# Patient Record
Sex: Female | Born: 1955 | Race: White | Hispanic: No | Marital: Single
Health system: Southern US, Community
[De-identification: ages and names within clinical notes are randomized; demographics above are authoritative.]

## PROBLEM LIST (undated history)

## (undated) DIAGNOSIS — C801 Malignant (primary) neoplasm, unspecified: Secondary | ICD-10-CM

## (undated) HISTORY — PX: ABDOMINAL HYSTERECTOMY: SHX81

---

## 2006-09-06 ENCOUNTER — Ambulatory Visit: Payer: Self-pay

## 2007-01-04 ENCOUNTER — Ambulatory Visit: Payer: Self-pay | Admitting: Unknown Physician Specialty

## 2007-05-09 ENCOUNTER — Ambulatory Visit: Payer: Self-pay | Admitting: Obstetrics and Gynecology

## 2007-10-16 ENCOUNTER — Ambulatory Visit: Payer: Self-pay

## 2008-11-05 ENCOUNTER — Ambulatory Visit: Payer: Self-pay | Admitting: Obstetrics and Gynecology

## 2009-02-14 DIAGNOSIS — C801 Malignant (primary) neoplasm, unspecified: Secondary | ICD-10-CM

## 2009-02-14 HISTORY — DX: Malignant (primary) neoplasm, unspecified: C80.1

## 2009-12-02 ENCOUNTER — Ambulatory Visit: Payer: Self-pay | Admitting: Obstetrics and Gynecology

## 2010-01-21 ENCOUNTER — Ambulatory Visit: Payer: Self-pay | Admitting: Internal Medicine

## 2010-07-27 ENCOUNTER — Ambulatory Visit: Payer: Self-pay | Admitting: Obstetrics and Gynecology

## 2011-09-13 ENCOUNTER — Ambulatory Visit: Payer: Self-pay | Admitting: Obstetrics and Gynecology

## 2012-07-31 ENCOUNTER — Ambulatory Visit: Payer: Self-pay | Admitting: Obstetrics and Gynecology

## 2012-10-24 ENCOUNTER — Ambulatory Visit: Payer: Self-pay | Admitting: Obstetrics and Gynecology

## 2013-12-19 ENCOUNTER — Ambulatory Visit: Payer: Self-pay | Admitting: Obstetrics and Gynecology

## 2014-07-09 ENCOUNTER — Other Ambulatory Visit: Payer: Self-pay | Admitting: Obstetrics and Gynecology

## 2014-07-09 DIAGNOSIS — Z1231 Encounter for screening mammogram for malignant neoplasm of breast: Secondary | ICD-10-CM

## 2014-08-25 ENCOUNTER — Ambulatory Visit
Admission: RE | Admit: 2014-08-25 | Discharge: 2014-08-25 | Disposition: A | Discharge status: Home / Self Care | Payer: BLUE CROSS/BLUE SHIELD | Source: Ambulatory Visit | Attending: Physician Assistant | Admitting: Physician Assistant

## 2014-08-25 ENCOUNTER — Ambulatory Visit
Admission: RE | Admit: 2014-08-25 | Discharge: 2014-08-25 | Disposition: A | Discharge status: Home / Self Care | Payer: BLUE CROSS/BLUE SHIELD | Source: Ambulatory Visit | Attending: Internal Medicine | Admitting: Internal Medicine

## 2014-08-25 ENCOUNTER — Other Ambulatory Visit: Payer: Self-pay | Admitting: Physician Assistant

## 2014-08-25 DIAGNOSIS — G8929 Other chronic pain: Secondary | ICD-10-CM

## 2014-08-25 DIAGNOSIS — M79645 Pain in left finger(s): Secondary | ICD-10-CM | POA: Diagnosis present

## 2014-08-25 DIAGNOSIS — M189 Osteoarthritis of first carpometacarpal joint, unspecified: Secondary | ICD-10-CM | POA: Diagnosis not present

## 2014-08-25 DIAGNOSIS — M79642 Pain in left hand: Secondary | ICD-10-CM | POA: Diagnosis present

## 2015-01-01 ENCOUNTER — Ambulatory Visit: Payer: BLUE CROSS/BLUE SHIELD

## 2015-01-02 ENCOUNTER — Ambulatory Visit
Admission: RE | Admit: 2015-01-02 | Discharge: 2015-01-02 | Disposition: A | Discharge status: Home / Self Care | Payer: BLUE CROSS/BLUE SHIELD | Source: Ambulatory Visit | Attending: Obstetrics and Gynecology | Admitting: Obstetrics and Gynecology

## 2015-01-02 DIAGNOSIS — Z1231 Encounter for screening mammogram for malignant neoplasm of breast: Secondary | ICD-10-CM | POA: Diagnosis present

## 2015-01-02 HISTORY — DX: Malignant (primary) neoplasm, unspecified: C80.1

## 2016-05-05 ENCOUNTER — Other Ambulatory Visit: Payer: Self-pay | Admitting: Internal Medicine

## 2016-05-05 DIAGNOSIS — Z1231 Encounter for screening mammogram for malignant neoplasm of breast: Secondary | ICD-10-CM

## 2016-06-03 ENCOUNTER — Other Ambulatory Visit: Payer: Self-pay | Admitting: Internal Medicine

## 2016-06-03 ENCOUNTER — Ambulatory Visit
Admission: RE | Admit: 2016-06-03 | Discharge: 2016-06-03 | Disposition: A | Discharge status: Home / Self Care | Payer: BLUE CROSS/BLUE SHIELD | Source: Ambulatory Visit | Attending: Internal Medicine | Admitting: Internal Medicine

## 2016-06-03 DIAGNOSIS — Z1231 Encounter for screening mammogram for malignant neoplasm of breast: Secondary | ICD-10-CM | POA: Diagnosis present

## 2017-07-11 ENCOUNTER — Other Ambulatory Visit: Payer: Self-pay | Admitting: Internal Medicine

## 2017-07-11 DIAGNOSIS — Z1231 Encounter for screening mammogram for malignant neoplasm of breast: Secondary | ICD-10-CM

## 2017-08-22 ENCOUNTER — Ambulatory Visit
Admission: RE | Admit: 2017-08-22 | Discharge: 2017-08-22 | Disposition: A | Discharge status: Home / Self Care | Payer: BLUE CROSS/BLUE SHIELD | Source: Ambulatory Visit | Attending: Internal Medicine | Admitting: Internal Medicine

## 2017-08-22 DIAGNOSIS — Z1231 Encounter for screening mammogram for malignant neoplasm of breast: Secondary | ICD-10-CM | POA: Insufficient documentation

## 2018-08-29 ENCOUNTER — Other Ambulatory Visit: Payer: Self-pay | Admitting: Internal Medicine

## 2018-08-29 DIAGNOSIS — Z1231 Encounter for screening mammogram for malignant neoplasm of breast: Secondary | ICD-10-CM

## 2018-09-13 ENCOUNTER — Other Ambulatory Visit: Payer: Self-pay | Admitting: Gynecology

## 2018-09-13 DIAGNOSIS — N838 Other noninflammatory disorders of ovary, fallopian tube and broad ligament: Secondary | ICD-10-CM

## 2018-09-18 ENCOUNTER — Ambulatory Visit
Admission: RE | Admit: 2018-09-18 | Discharge: 2018-09-18 | Disposition: A | Discharge status: Home / Self Care | Payer: BC Managed Care – PPO | Source: Ambulatory Visit | Attending: Gynecology | Admitting: Gynecology

## 2018-09-18 ENCOUNTER — Other Ambulatory Visit: Payer: Self-pay

## 2018-09-18 ENCOUNTER — Encounter (INDEPENDENT_AMBULATORY_CARE_PROVIDER_SITE_OTHER): Payer: Self-pay

## 2018-09-18 DIAGNOSIS — N838 Other noninflammatory disorders of ovary, fallopian tube and broad ligament: Secondary | ICD-10-CM | POA: Diagnosis present

## 2018-10-03 ENCOUNTER — Other Ambulatory Visit: Payer: Self-pay

## 2018-10-03 ENCOUNTER — Ambulatory Visit
Admission: RE | Admit: 2018-10-03 | Discharge: 2018-10-03 | Disposition: A | Discharge status: Home / Self Care | Payer: BC Managed Care – PPO | Source: Ambulatory Visit | Attending: Internal Medicine | Admitting: Internal Medicine

## 2018-10-03 DIAGNOSIS — Z1231 Encounter for screening mammogram for malignant neoplasm of breast: Secondary | ICD-10-CM | POA: Insufficient documentation

## 2018-11-14 ENCOUNTER — Other Ambulatory Visit: Payer: Self-pay

## 2018-11-14 ENCOUNTER — Ambulatory Visit: Payer: Self-pay

## 2018-11-14 DIAGNOSIS — Z23 Encounter for immunization: Secondary | ICD-10-CM

## 2019-04-11 ENCOUNTER — Telehealth: Payer: Self-pay

## 2019-04-11 NOTE — Telephone Encounter (Signed)
Patient called our office with concerns of a contact exposure.  She was around a student that has possibly tested positive for Covid 19.  She was around the student and was wearing a mask.  Her concern is that she may have been less than 6 feet.  As we discussed the situation, her amount of time around the student was less than 15 minutes.  We encouraged her to monitor her symptoms.  She is not considered a close contact but we will test her as an exposure due to her request..

## 2019-04-12 ENCOUNTER — Other Ambulatory Visit: Payer: Self-pay

## 2019-04-12 ENCOUNTER — Ambulatory Visit: Payer: Self-pay

## 2019-04-12 DIAGNOSIS — Z20822 Contact with and (suspected) exposure to covid-19: Secondary | ICD-10-CM

## 2019-04-12 LAB — POC COVID19 BINAXNOW: SARS Coronavirus 2 Ag: NEGATIVE

## 2019-10-04 ENCOUNTER — Other Ambulatory Visit: Payer: Self-pay | Admitting: Internal Medicine

## 2019-10-08 ENCOUNTER — Other Ambulatory Visit: Payer: Self-pay | Admitting: Internal Medicine

## 2019-10-08 ENCOUNTER — Other Ambulatory Visit: Payer: Self-pay | Admitting: Gynecology

## 2019-10-08 DIAGNOSIS — Z1231 Encounter for screening mammogram for malignant neoplasm of breast: Secondary | ICD-10-CM

## 2019-10-23 ENCOUNTER — Ambulatory Visit
Admission: RE | Admit: 2019-10-23 | Discharge: 2019-10-23 | Disposition: A | Discharge status: Home / Self Care | Payer: BC Managed Care – PPO | Source: Ambulatory Visit | Attending: Gynecology | Admitting: Gynecology

## 2019-10-23 ENCOUNTER — Other Ambulatory Visit: Payer: Self-pay

## 2019-10-23 DIAGNOSIS — Z1231 Encounter for screening mammogram for malignant neoplasm of breast: Secondary | ICD-10-CM | POA: Insufficient documentation

## 2019-10-29 ENCOUNTER — Other Ambulatory Visit: Payer: Self-pay | Admitting: Gynecology

## 2019-10-29 DIAGNOSIS — R928 Other abnormal and inconclusive findings on diagnostic imaging of breast: Secondary | ICD-10-CM

## 2019-10-29 DIAGNOSIS — N6489 Other specified disorders of breast: Secondary | ICD-10-CM

## 2019-11-06 ENCOUNTER — Other Ambulatory Visit: Payer: Self-pay

## 2019-11-06 ENCOUNTER — Ambulatory Visit
Admission: RE | Admit: 2019-11-06 | Discharge: 2019-11-06 | Disposition: A | Discharge status: Home / Self Care | Payer: BC Managed Care – PPO | Source: Ambulatory Visit | Attending: Gynecology | Admitting: Gynecology

## 2019-11-06 DIAGNOSIS — R928 Other abnormal and inconclusive findings on diagnostic imaging of breast: Secondary | ICD-10-CM

## 2019-11-06 DIAGNOSIS — N6489 Other specified disorders of breast: Secondary | ICD-10-CM

## 2019-11-07 ENCOUNTER — Other Ambulatory Visit: Payer: Self-pay | Admitting: Internal Medicine

## 2019-11-07 DIAGNOSIS — Z1231 Encounter for screening mammogram for malignant neoplasm of breast: Secondary | ICD-10-CM

## 2020-09-30 ENCOUNTER — Other Ambulatory Visit: Payer: Self-pay | Admitting: Internal Medicine

## 2020-09-30 DIAGNOSIS — Z1231 Encounter for screening mammogram for malignant neoplasm of breast: Secondary | ICD-10-CM

## 2020-10-27 ENCOUNTER — Ambulatory Visit
Admission: RE | Admit: 2020-10-27 | Discharge: 2020-10-27 | Disposition: A | Discharge status: Home / Self Care | Payer: BC Managed Care – PPO | Source: Ambulatory Visit | Attending: Internal Medicine | Admitting: Internal Medicine

## 2020-10-27 ENCOUNTER — Other Ambulatory Visit: Payer: Self-pay

## 2020-10-27 DIAGNOSIS — Z1231 Encounter for screening mammogram for malignant neoplasm of breast: Secondary | ICD-10-CM | POA: Diagnosis not present

## 2021-02-22 IMAGING — MG DIGITAL SCREENING BILAT W/ TOMO W/ CAD
6 of 10 series · 6 of 30 positions shown · non-contrast
Comparison: Previous exam(s).

CLINICAL DATA: Screening.

EXAM:
DIGITAL SCREENING BILATERAL MAMMOGRAM WITH TOMO AND CAD

[R CC synth-2D]
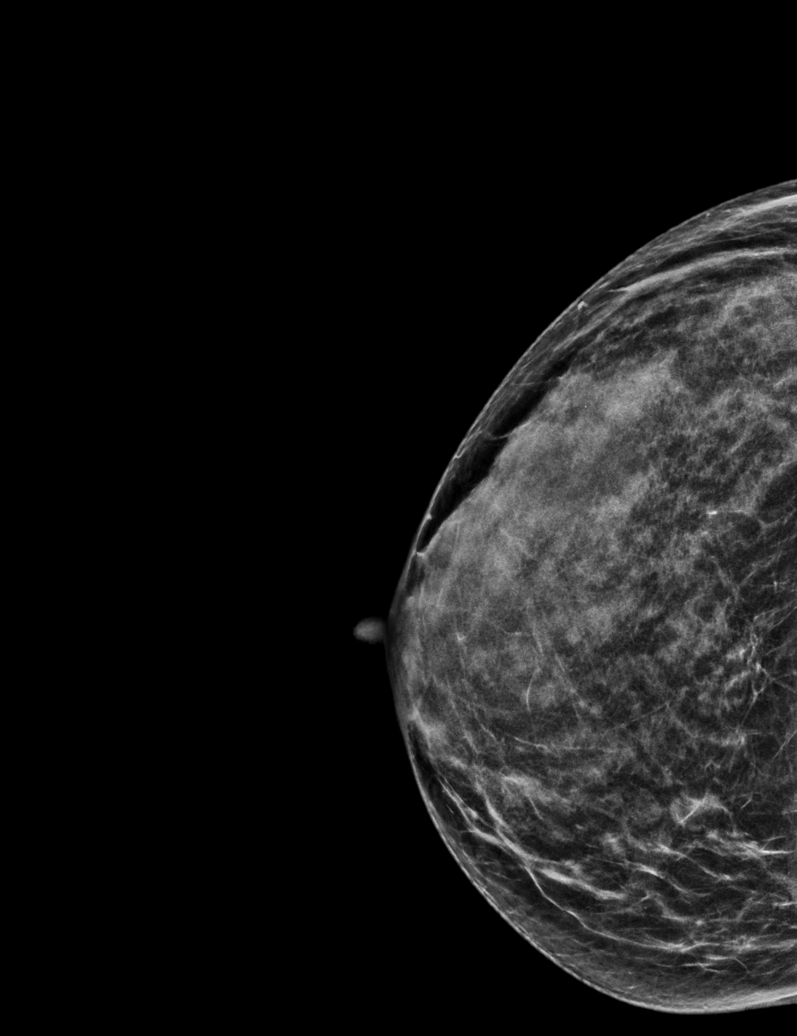

[L MLO synth-2D]
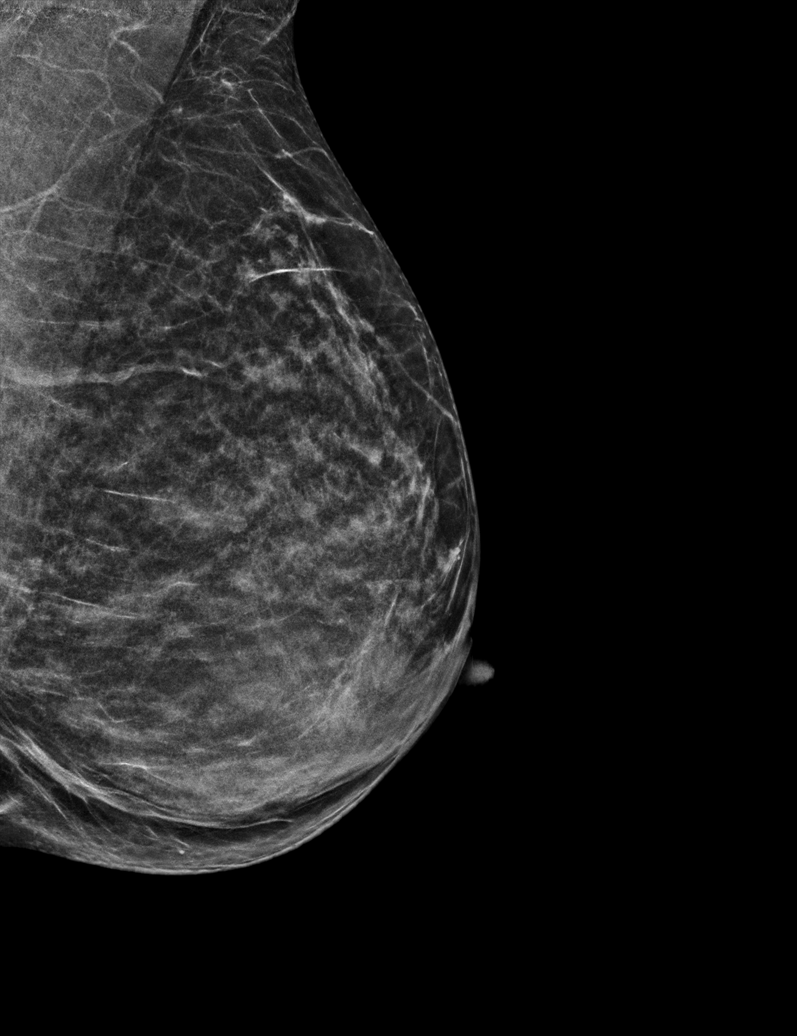

[R MLO synth-2D]
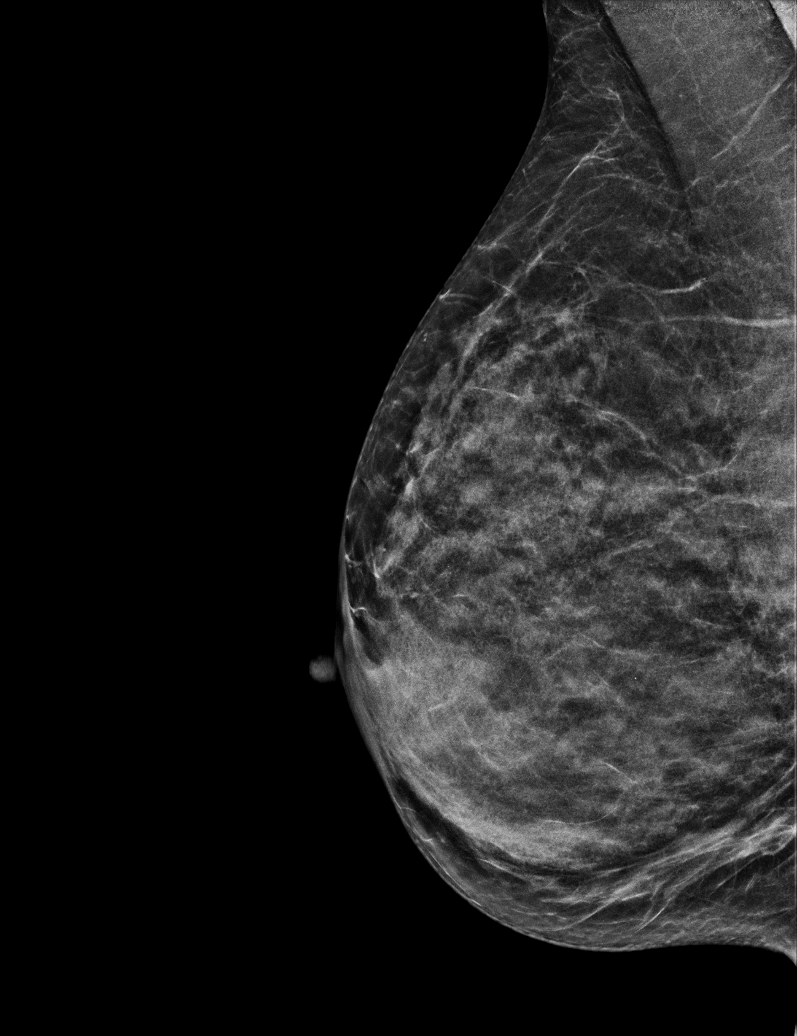

[L CC synth-2D]
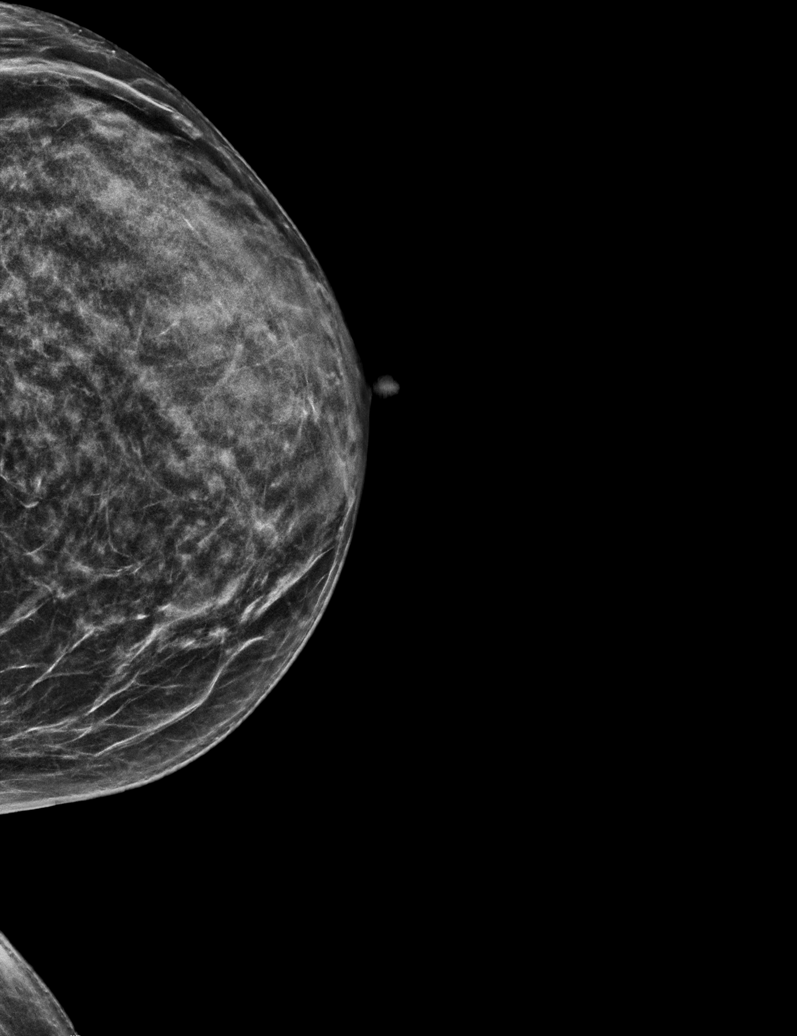

[L XCCL synth-2D]
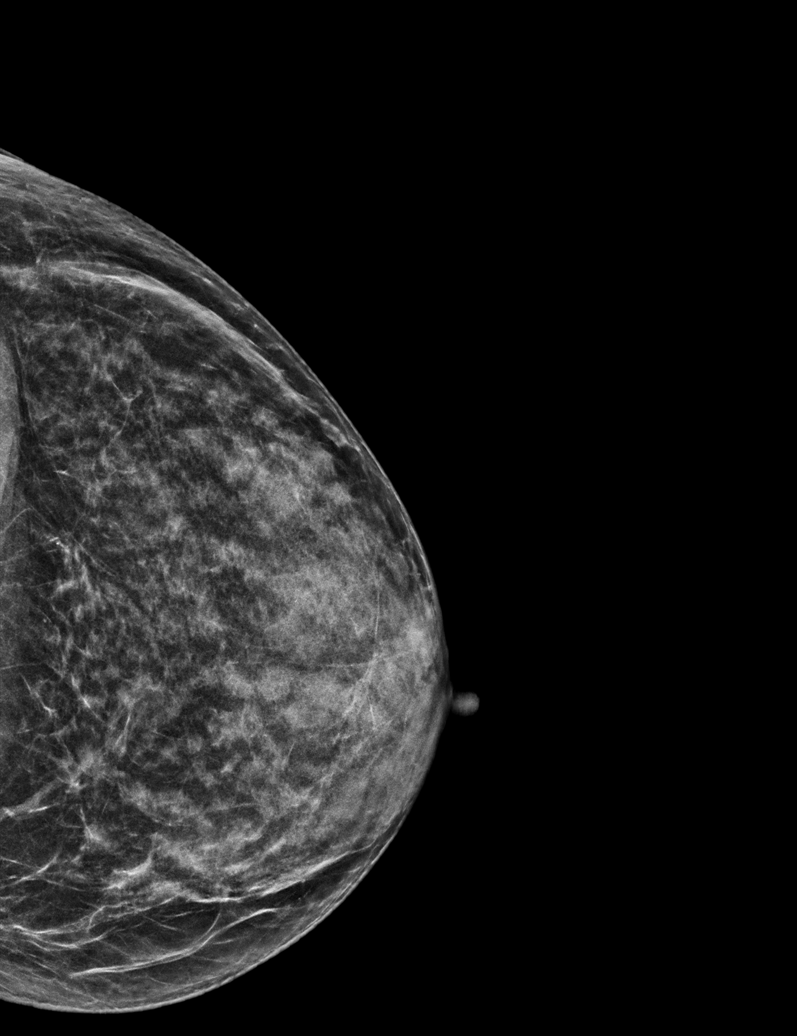

[R CC tomo · tomo slice 21/42.0]
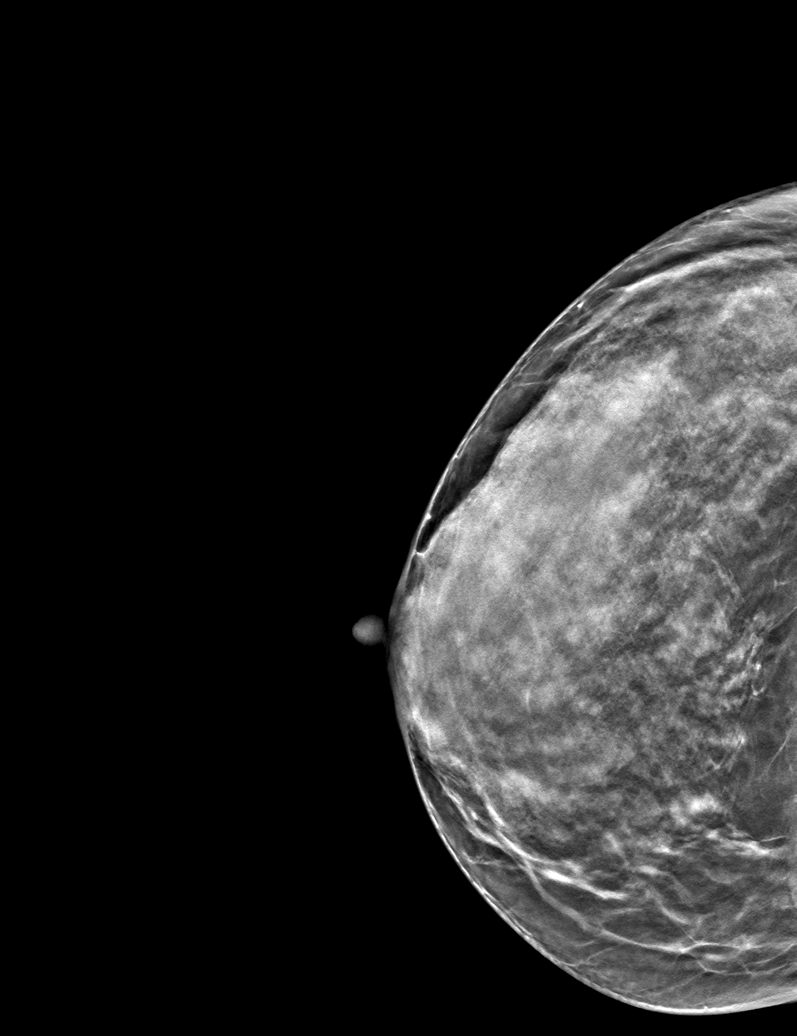

[6 of 30 positions shown; findings below may reference images not displayed]

ACR Breast Density Category d: The breast tissue is extremely dense,
which lowers the sensitivity of mammography.
FINDINGS: In the right breast, a possible focal asymmetry warrants further
evaluation. It is seen in the upper outer breast at posterior depth.
In the left breast, no findings suspicious for malignancy. Images
were processed with CAD.
IMPRESSION: Further evaluation is suggested for possible focal asymmetry in the
right breast.

RECOMMENDATION:
Diagnostic mammogram and possibly ultrasound of the right breast.
(Code:[id number])

The patient will be contacted regarding the findings, and additional
imaging will be scheduled.

BI-RADS CATEGORY  0: Incomplete. Need additional imaging evaluation
and/or prior mammograms for comparison.

## 2021-09-23 ENCOUNTER — Other Ambulatory Visit: Payer: Self-pay | Admitting: Internal Medicine

## 2021-09-23 DIAGNOSIS — Z1231 Encounter for screening mammogram for malignant neoplasm of breast: Secondary | ICD-10-CM

## 2021-11-05 ENCOUNTER — Ambulatory Visit
Admission: RE | Admit: 2021-11-05 | Discharge: 2021-11-05 | Disposition: A | Discharge status: Home / Self Care | Payer: BC Managed Care – PPO | Source: Ambulatory Visit | Attending: Internal Medicine | Admitting: Internal Medicine

## 2021-11-05 DIAGNOSIS — Z1231 Encounter for screening mammogram for malignant neoplasm of breast: Secondary | ICD-10-CM | POA: Diagnosis present

## 2022-02-01 ENCOUNTER — Ambulatory Visit: Payer: BC Managed Care – PPO

## 2022-07-28 ENCOUNTER — Other Ambulatory Visit: Payer: Self-pay | Admitting: Internal Medicine

## 2022-07-28 DIAGNOSIS — R1310 Dysphagia, unspecified: Secondary | ICD-10-CM

## 2022-08-04 ENCOUNTER — Ambulatory Visit
Admission: RE | Admit: 2022-08-04 | Discharge: 2022-08-04 | Disposition: A | Discharge status: Home / Self Care | Payer: BC Managed Care – PPO | Source: Ambulatory Visit | Attending: Internal Medicine | Admitting: Internal Medicine

## 2022-08-04 DIAGNOSIS — R1310 Dysphagia, unspecified: Secondary | ICD-10-CM | POA: Diagnosis present

## 2023-03-07 ENCOUNTER — Other Ambulatory Visit: Payer: Self-pay | Admitting: Internal Medicine

## 2023-03-07 DIAGNOSIS — Z1231 Encounter for screening mammogram for malignant neoplasm of breast: Secondary | ICD-10-CM

## 2023-03-22 ENCOUNTER — Ambulatory Visit
Admission: RE | Admit: 2023-03-22 | Discharge: 2023-03-22 | Disposition: A | Discharge status: Home / Self Care | Payer: BC Managed Care – PPO | Source: Ambulatory Visit | Attending: Internal Medicine | Admitting: Internal Medicine

## 2023-03-22 DIAGNOSIS — Z1231 Encounter for screening mammogram for malignant neoplasm of breast: Secondary | ICD-10-CM | POA: Insufficient documentation
